# Patient Record
Sex: Male | Born: 1965 | Race: Black or African American | Hispanic: No | Marital: Married | Smoking: Former smoker
Health system: Southern US, Community
[De-identification: ages and names within clinical notes are randomized; demographics above are authoritative.]

## PROBLEM LIST (undated history)

## (undated) DIAGNOSIS — E119 Type 2 diabetes mellitus without complications: Secondary | ICD-10-CM

## (undated) DIAGNOSIS — E669 Obesity, unspecified: Secondary | ICD-10-CM

## (undated) DIAGNOSIS — I10 Essential (primary) hypertension: Secondary | ICD-10-CM

---

## 2009-07-06 ENCOUNTER — Ambulatory Visit: Payer: Self-pay | Admitting: Family Medicine

## 2010-02-17 ENCOUNTER — Ambulatory Visit: Payer: Self-pay | Admitting: Internal Medicine

## 2014-09-29 ENCOUNTER — Ambulatory Visit: Payer: PRIVATE HEALTH INSURANCE

## 2014-09-29 ENCOUNTER — Ambulatory Visit
Admission: EM | Admit: 2014-09-29 | Discharge: 2014-09-29 | Disposition: A | Discharge status: Home / Self Care | Payer: PRIVATE HEALTH INSURANCE | Attending: Family Medicine | Admitting: Family Medicine

## 2014-09-29 DIAGNOSIS — T148XXA Other injury of unspecified body region, initial encounter: Secondary | ICD-10-CM

## 2014-09-29 DIAGNOSIS — T148 Other injury of unspecified body region: Secondary | ICD-10-CM

## 2014-09-29 DIAGNOSIS — S161XXA Strain of muscle, fascia and tendon at neck level, initial encounter: Secondary | ICD-10-CM

## 2014-09-29 HISTORY — DX: Obesity, unspecified: E66.9

## 2014-09-29 MED ORDER — KETOROLAC TROMETHAMINE 60 MG/2ML IM SOLN
60.0000 mg | Freq: Once | INTRAMUSCULAR | Status: AC
  Administered 2014-09-29: 60 mg via INTRAMUSCULAR

## 2014-09-29 MED ORDER — CYCLOBENZAPRINE HCL 10 MG PO TABS: 10.0000 mg | ORAL_TABLET | Freq: Three times a day (TID) | ORAL | Status: DC | PRN

## 2014-09-29 MED ORDER — IBUPROFEN 800 MG PO TABS: 800.0000 mg | ORAL_TABLET | Freq: Three times a day (TID) | ORAL | Status: DC | PRN

## 2014-09-29 NOTE — ED Provider Notes (Signed)
[facility] Emergency Department Provider Note  ____________________________________________  Time seen: Approximately 9:59 AM  I have reviewed the triage vital signs and the nursing notes.   HISTORY  Chief Complaint Neck Injury   HPI Douglas Hopkins is a 49 y.o. male presents with complaints of left lateral neck pain. Patient reports that this past Friday he had been sitting still for at least an hour watching TV and states that he then turned his neck to the left and the right. States immediately after turning head to the left he felt a pain to the left side of his neck that is sharp. Patient states that pain was present for a few minutes and then got better with rubbing the area but never fully resolved. States Saturday morning woke up and the left neck had pain present again with movement and has continued.  Patient states that pain is primarily with movement. Patient states that he sits still and does not move he has no pain. Denies pain radiation. Denies chest pain or shortness of breath. States pain is improved with over-the-counter ibuprofen and heat compresses. Denies fall or direct injury. Denies arm radiation.Denies tingling or numbness. States left arm dominant.   States pain improves with ibuprofen and heat and rubbing the area. States feels intermittent spasms in that area. States current pain as states he is sitting still, however states once moving pain is 7/10 and sharp and "catching". Denies pain radiation. States pain is constantly in same area and present with movement.    Past Medical History  Diagnosis Date  . Obesity     There are no active problems to display for this patient.   History reviewed. No pertinent past surgical history.  No current outpatient prescriptions on file.  Allergies Review of patient's allergies indicates no known allergies.  Family History  Problem Relation Age of Onset  . Diabetes Mother   . Hypertension  Father   Denies cardiac history.   Social History Social History  Substance Use Topics  . Smoking status: Former Games developer  . Smokeless tobacco: None  . Alcohol Use: Yes     Comment: socially    Review of Systems Constitutional: No fever/chills Eyes: No visual changes. ENT: No sore throat. Cardiovascular: Denies chest pain. Respiratory: Denies shortness of breath. Gastrointestinal: No abdominal pain.  No nausea, no vomiting.  No diarrhea.  No constipation. Genitourinary: Negative for dysuria. Musculoskeletal: Negative for back pain.left neck pain as above.  Skin: Negative for rash. Neurological: Negative for headaches, focal weakness or numbness.  10-point ROS otherwise negative.  ____________________________________________   PHYSICAL EXAM:  VITAL SIGNS: ED Triage Vitals  Enc Vitals Group     BP 09/29/14 0938 155/83 mmHg     Pulse Rate 09/29/14 0938 92     Resp 09/29/14 0938 16     Temp 09/29/14 0938 97.9 F (36.6 C)     Temp Source 09/29/14 0938 Oral     SpO2 09/29/14 0938 98 %     Weight 09/29/14 0938 280 lb (127.007 kg)     Height 09/29/14 0938 6' (1.829 m)     Head Cir --      Peak Flow --      Pain Score 09/29/14 0940 7     Pain Loc --      Pain Edu? --      Excl. in GC? --     Constitutional: Alert and oriented. Well appearing and in no acute distress. Eyes: Conjunctivae are  normal. PERRL. EOMI. Head: Atraumatic.  Ears: no erythema, normal TMs bilaterally.   Nose: No congestion/rhinnorhea.  Mouth/Throat: Mucous membranes are moist.  Oropharynx non-erythematous. No carotid bruits. Neck: No stridor.  No cervical spine tenderness to palpation. Hematological/Lymphatic/Immunilogical: No cervical lymphadenopathy. Cardiovascular: Normal rate, regular rhythm. Grossly normal heart sounds.  Good peripheral circulation. Respiratory: Normal respiratory effort.  No retractions. Lungs CTAB. Gastrointestinal: Soft and nontender. No distention. Normal Bowel sounds.   No abdominal bruits. No CVA tenderness. Musculoskeletal: No lower or upper extremity tenderness nor edema.  No joint effusions. Bilateral pedal pulses equal and easily palpated. Except: No thoracic or lumbar tenderness to palpation. No right trapezius tenderness to palpation. Minimal lower cervical midline tenderness to palpation, minimal to mild left paracervical and mild to moderate left trapezius tenderness to palpation. Positive muscle spasm elicited with left and right rotation movements. No bony tenderness to left shoulder. Left arm nontender. Bilateral hand grips equal and strong. 5 out of 5 strength to bilateral upper extremities. Bilateral distal radial pulses equal and easily palpated. Sensation and motor intact to bilateral upper extremities. Neurologic:  Normal speech and language. No gross focal neurologic deficits are appreciated. No gait instability. Skin:  Skin is warm, dry and intact. No rash noted. Psychiatric: Mood and affect are normal. Speech and behavior are normal.  ____________________________________________   LABS (all labs ordered are listed, but only abnormal results are displayed)  Labs Reviewed - No data to display  RADIOLOGY  EXAM: CERVICAL SPINE 4+ VIEWS  COMPARISON: None.  FINDINGS: Frontal, lateral, open-mouth odontoid, and bilateral oblique views were obtained. There is no demonstrable fracture or spondylolisthesis. Prevertebral soft tissues and predental space regions are normal. The disc spaces appear normal. There is an anterior osteophyte along the inferior aspect of C5. There is a small amount of calcification in the anterior ligament at C5-6. There is facet osteoarthritic change with exit foraminal narrowing at C3-4, C4-5, and C5-6 bilaterally.  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 No fracture or spondylolisthesis.   Electronically Signed By: Bretta Bang III M.D. On: 09/29/2014 10:57         INITIAL IMPRESSION / ASSESSMENT AND PLAN / ED COURSE  Pertinent labs & imaging results that were available during my care of the patient were reviewed by me and considered in my medical decision making (see chart for details).  Very well appearing. No acute distress. Presents for complaints of left neck pain post twisting injury. Improves with OTC ibuprofen, heat and rubbing. States pain is only with movement and feels "spasms". Pain fully reproducible per patient with direct palpation and left and right rotation. Minimal midline TTP.  Denies chest pain, shortness of breath, pain radiation, numbness or tingling or changes in sensation.   Xray negative for fracture or spondylolisthesis. 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   Presents with left sided paracervical and left trapezius pain, point muscular TTP, pain fully reproducible. Post  IM toradol reports feeling better. Will treat with oral ibuprofen, flexeril and supportive treatments. Discussed strict follow-up and return parameters. Patient verbalized understanding and agreed to plan. Follow-up with primary care physician this week.   Patient blood pressure elevated in urgent care patient reports that no history of high blood pressure and reports that his blood pressures probably elevated that he is hurting and stressed, as normal recently when checked. Counseled patient and patient wife on phone to monitor at home and keep a journal. Discussed follow-up closely with primary care physician within  the next week for follow-up and further reevaluation. Patient denies chest pain, shortness of breath, headache, dizziness, vision changes or other complaints. Discussed strict follow-up and return parameters. Patient verbalizes understanding and agreed to plan.   ____________________________________________   FINAL CLINICAL IMPRESSION(S) / ED DIAGNOSES  Final diagnoses:  Muscle strain  Cervical  strain, initial encounter       Renford Dills, NP 09/29/14 1406

## 2014-09-29 NOTE — Discharge Instructions (Signed)
Take medication as prescribed. Continue to alternate heat and ice for comfort. Avoid strenuous activity. Do not take muscle relaxant while driving or at work.  Follow-up closely with her primary care physician this week. Return to urgent care for new or worsening concerns.  Cervical Strain  Cervical strain and sprain are injuries that commonly occur when the neck is forcefully whipped backward or forward, such as during a motor vehicle accident or during contact sports. The muscles, ligaments, tendons, discs, and nerves of the neck are susceptible to injury when this occurs. RISK FACTORS Risk of having a whiplash injury increases if:  Osteoarthritis of the spine.  Situations that make head or neck accidents or trauma more likely.  High-risk sports (football, rugby, wrestling, hockey, auto racing, gymnastics, diving, contact karate, or boxing).  Poor strength and flexibility of the neck.  Previous neck injury.  Poor tackling technique.  Improperly fitted or padded equipment. SYMPTOMS   Pain or stiffness in the front or back of neck or both.  Symptoms may present immediately or up to 24 hours after injury.  Dizziness, headache, nausea, and vomiting.  Muscle spasm with soreness and stiffness in the neck.  Tenderness and swelling at the injury site. PREVENTION  Learn and use proper technique (avoid tackling with the head, spearing, and head-butting; use proper falling techniques to avoid landing on the head).  Warm up and stretch properly before activity.  Maintain physical fitness:  Strength, flexibility, and endurance.  Cardiovascular fitness.  Wear properly fitted and padded protective equipment, such as padded soft collars, for participation in contact sports. PROGNOSIS  Recovery from cervical strain and sprain injuries is dependent on the extent of the injury. These injuries are usually curable in 1 week to 3 months with appropriate treatment.  RELATED COMPLICATIONS     Temporary numbness and weakness may occur if the nerve roots are damaged, and this may persist until the nerve has completely healed.  Chronic pain due to frequent recurrence of symptoms.  Prolonged healing, especially if activity is resumed too soon (before complete recovery). TREATMENT  Treatment initially involves the use of ice and medication to help reduce pain and inflammation. It is also important to perform strengthening and stretching exercises and modify activities that worsen symptoms so the injury does not get worse. These exercises may be performed at home or with a therapist. For patients who experience severe symptoms, a soft, padded collar may be recommended to be worn around the neck.  Improving your posture may help reduce symptoms. Posture improvement includes pulling your chin and abdomen in while sitting or standing. If you are sitting, sit in a firm chair with your buttocks against the back of the chair. While sleeping, try replacing your pillow with a small towel rolled to 2 inches in diameter, or use a cervical pillow or soft cervical collar. Poor sleeping positions delay healing.  For patients with nerve root damage, which causes numbness or weakness, the use of a cervical traction apparatus may be recommended. Surgery is rarely necessary for these injuries. However, cervical strain and sprains that are present at birth (congenital) may require surgery. MEDICATION   If pain medication is necessary, nonsteroidal anti-inflammatory medications, such as aspirin and ibuprofen, or other minor pain relievers, such as acetaminophen, are often recommended.  Do not take pain medication for 7 days before surgery.  Prescription pain relievers may be given if deemed necessary by your caregiver. Use only as directed and only as much as you need. HEAT AND  COLD:   Cold treatment (icing) relieves pain and reduces inflammation. Cold treatment should be applied for 10 to 15 minutes every  2 to 3 hours for inflammation and pain and immediately after any activity that aggravates your symptoms. Use ice packs or an ice massage.  Heat treatment may be used prior to performing the stretching and strengthening activities prescribed by your caregiver, physical therapist, or athletic trainer. Use a heat pack or a warm soak. SEEK MEDICAL CARE IF:   Symptoms get worse or do not improve in 2 weeks despite treatment.  New, unexplained symptoms develop (drugs used in treatment may produce side effects). EXERCISES RANGE OF MOTION (ROM) AND STRETCHING EXERCISES - Cervical Strain and Sprain These exercises may help you when beginning to rehabilitate your injury. In order to successfully resolve your symptoms, you must improve your posture. These exercises are designed to help reduce the forward-head and rounded-shoulder posture which contributes to this condition. Your symptoms may resolve with or without further involvement from your physician, physical therapist or athletic trainer. While completing these exercises, remember:   Restoring tissue flexibility helps normal motion to return to the joints. This allows healthier, less painful movement and activity.  An effective stretch should be held for at least 20 seconds, although you may need to begin with shorter hold times for comfort.  A stretch should never be painful. You should only feel a gentle lengthening or release in the stretched tissue. STRETCH- Axial Extensors  Lie on your back on the floor. You may bend your knees for comfort. Place a rolled-up hand towel or dish towel, about 2 inches in diameter, under the part of your head that makes contact with the floor.  Gently tuck your chin, as if trying to make a "double chin," until you feel a gentle stretch at the base of your head.  Hold __________ seconds. Repeat __________ times. Complete this exercise __________ times per day.  STRETCH - Axial Extension   Stand or sit on a firm  surface. Assume a good posture: chest up, shoulders drawn back, abdominal muscles slightly tense, knees unlocked (if standing) and feet hip width apart.  Slowly retract your chin so your head slides back and your chin slightly lowers. Continue to look straight ahead.  You should feel a gentle stretch in the back of your head. Be certain not to feel an aggressive stretch since this can cause headaches later.  Hold for __________ seconds. Repeat __________ times. Complete this exercise __________ times per day. STRETCH - Cervical Side Bend   Stand or sit on a firm surface. Assume a good posture: chest up, shoulders drawn back, abdominal muscles slightly tense, knees unlocked (if standing) and feet hip width apart.  Without letting your nose or shoulders move, slowly tip your right / left ear to your shoulder until your feel a gentle stretch in the muscles on the opposite side of your neck.  Hold __________ seconds. Repeat __________ times. Complete this exercise __________ times per day. STRETCH - Cervical Rotators   Stand or sit on a firm surface. Assume a good posture: chest up, shoulders drawn back, abdominal muscles slightly tense, knees unlocked (if standing) and feet hip width apart.  Keeping your eyes level with the ground, slowly turn your head until you feel a gentle stretch along the back and opposite side of your neck.  Hold __________ seconds. Repeat __________ times. Complete this exercise __________ times per day. RANGE OF MOTION - Neck Circles   Stand or  sit on a firm surface. Assume a good posture: chest up, shoulders drawn back, abdominal muscles slightly tense, knees unlocked (if standing) and feet hip width apart.  Gently roll your head down and around from the back of one shoulder to the back of the other. The motion should never be forced or painful.  Repeat the motion 10-20 times, or until you feel the neck muscles relax and loosen. Repeat __________ times. Complete  the exercise __________ times per day. STRENGTHENING EXERCISES - Cervical Strain and Sprain These exercises may help you when beginning to rehabilitate your injury. They may resolve your symptoms with or without further involvement from your physician, physical therapist, or athletic trainer. While completing these exercises, remember:   Muscles can gain both the endurance and the strength needed for everyday activities through controlled exercises.  Complete these exercises as instructed by your physician, physical therapist, or athletic trainer. Progress the resistance and repetitions only as guided.  You may experience muscle soreness or fatigue, but the pain or discomfort you are trying to eliminate should never worsen during these exercises. If this pain does worsen, stop and make certain you are following the directions exactly. If the pain is still present after adjustments, discontinue the exercise until you can discuss the trouble with your clinician. STRENGTH - Cervical Flexors, Isometric  Face a wall, standing about 6 inches away. Place a small pillow, a ball about 6-8 inches in diameter, or a folded towel between your forehead and the wall.  Slightly tuck your chin and gently push your forehead into the soft object. Push only with mild to moderate intensity, building up tension gradually. Keep your jaw and forehead relaxed.  Hold 10 to 20 seconds. Keep your breathing relaxed.  Release the tension slowly. Relax your neck muscles completely before you start the next repetition. Repeat __________ times. Complete this exercise __________ times per day. STRENGTH- Cervical Lateral Flexors, Isometric   Stand about 6 inches away from a wall. Place a small pillow, a ball about 6-8 inches in diameter, or a folded towel between the side of your head and the wall.  Slightly tuck your chin and gently tilt your head into the soft object. Push only with mild to moderate intensity, building up  tension gradually. Keep your jaw and forehead relaxed.  Hold 10 to 20 seconds. Keep your breathing relaxed.  Release the tension slowly. Relax your neck muscles completely before you start the next repetition. Repeat __________ times. Complete this exercise __________ times per day. STRENGTH - Cervical Extensors, Isometric   Stand about 6 inches away from a wall. Place a small pillow, a ball about 6-8 inches in diameter, or a folded towel between the back of your head and the wall.  Slightly tuck your chin and gently tilt your head back into the soft object. Push only with mild to moderate intensity, building up tension gradually. Keep your jaw and forehead relaxed.  Hold 10 to 20 seconds. Keep your breathing relaxed.  Release the tension slowly. Relax your neck muscles completely before you start the next repetition. Repeat __________ times. Complete this exercise __________ times per day. POSTURE AND BODY MECHANICS CONSIDERATIONS - Cervical Strain and Sprain Keeping correct posture when sitting, standing or completing your activities will reduce the stress put on different body tissues, allowing injured tissues a chance to heal and limiting painful experiences. The following are general guidelines for improved posture. Your physician or physical therapist will provide you with any instructions specific to your  needs. While reading these guidelines, remember:  The exercises prescribed by your provider will help you have the flexibility and strength to maintain correct postures.  The correct posture provides the optimal environment for your joints to work. All of your joints have less wear and tear when properly supported by a spine with good posture. This means you will experience a healthier, less painful body.  Correct posture must be practiced with all of your activities, especially prolonged sitting and standing. Correct posture is as important when doing repetitive low-stress activities  (typing) as it is when doing a single heavy-load activity (lifting). PROLONGED STANDING WHILE SLIGHTLY LEANING FORWARD When completing a task that requires you to lean forward while standing in one place for a long time, place either foot up on a stationary 2- to 4-inch high object to help maintain the best posture. When both feet are on the ground, the low back tends to lose its slight inward curve. If this curve flattens (or becomes too large), then the back and your other joints will experience too much stress, fatigue more quickly, and can cause pain.  RESTING POSITIONS Consider which positions are most painful for you when choosing a resting position. If you have pain with flexion-based activities (sitting, bending, stooping, squatting), choose a position that allows you to rest in a less flexed posture. You would want to avoid curling into a fetal position on your side. If your pain worsens with extension-based activities (prolonged standing, working overhead), avoid resting in an extended position such as sleeping on your stomach. Most people will find more comfort when they rest with their spine in a more neutral position, neither too rounded nor too arched. Lying on a non-sagging bed on your side with a pillow between your knees, or on your back with a pillow under your knees will often provide some relief. Keep in mind, being in any one position for a prolonged period of time, no matter how correct your posture, can still lead to stiffness. WALKING Walk with an upright posture. Your ears, shoulders, and hips should all line up. OFFICE WORK When working at a desk, create an environment that supports good, upright posture. Without extra support, muscles fatigue and lead to excessive strain on joints and other tissues. CHAIR:  A chair should be able to slide under your desk when your back makes contact with the back of the chair. This allows you to work closely.  The chair's height should allow  your eyes to be level with the upper part of your monitor and your hands to be slightly lower than your elbows.  Body position:  Your feet should make contact with the floor. If this is not possible, use a foot rest.  Keep your ears over your shoulders. This will reduce stress on your neck and low back. Document Released: 01/01/2005 Document Revised: 05/18/2013 Document Reviewed: 04/15/2008 [facility] Patient Information 2015 [place], [place]. This information is not intended to replace advice given to you by your health care provider. Make sure you discuss any questions you have with your health care provider.  Muscle Strain A muscle strain (pulled muscle) happens when a muscle is stretched beyond normal length. It happens when a sudden, violent force stretches your muscle too far. Usually, a few of the fibers in your muscle are torn. Muscle strain is common in athletes. Recovery usually takes 1-2 weeks. Complete healing takes 5-6 weeks.  HOME CARE   Follow the PRICE method of treatment to help your injury  get better. Do this the first 2-3 days after the injury:  Protect. Protect the muscle to keep it from getting injured again.  Rest. Limit your activity and rest the injured body part.  Ice. Put ice in a plastic bag. Place a towel between your skin and the bag. Then, apply the ice and leave it on from 15-20 minutes each hour. After the third day, switch to moist heat packs.  Compression. Use a splint or elastic bandage on the injured area for comfort. Do not put it on too tightly.  Elevate. Keep the injured body part above the level of your heart.  Only take medicine as told by your doctor.  Warm up before doing exercise to prevent future muscle strains. GET HELP IF:   You have more pain or puffiness (swelling) in the injured area.  You feel numbness, tingling, or notice a loss of strength in the injured area. MAKE SURE YOU:   Understand these instructions.  Will watch your  condition.  Will get help right away if you are not doing well or get worse. Document Released: 10/11/2007 Document Revised: 10/22/2012 Document Reviewed: 07/31/2012 [facility] Patient Information 2015 [place], [place]. This information is not intended to replace advice given to you by your health care provider. Make sure you discuss any questions you have with your health care provider.

## 2014-09-29 NOTE — ED Notes (Signed)
States was sitting at home Friday and when turned head to the right, felt a sudden pop lateral left neck. Ibuprofen helped for just a short time.Now pain radiates to left shoulder and up left lateral neck. Denies SOB, No SOB. Denies chest pain

## 2017-04-28 IMAGING — CR DG CERVICAL SPINE COMPLETE 4+V
6 series · 6 of 6 positions shown · non-contrast
Comparison: None.

CLINICAL DATA: Cervicalgia with radicular symptoms left upper
extremity

EXAM:
CERVICAL SPINE  4+ VIEWS

[c-spine lat (1 of 2)]
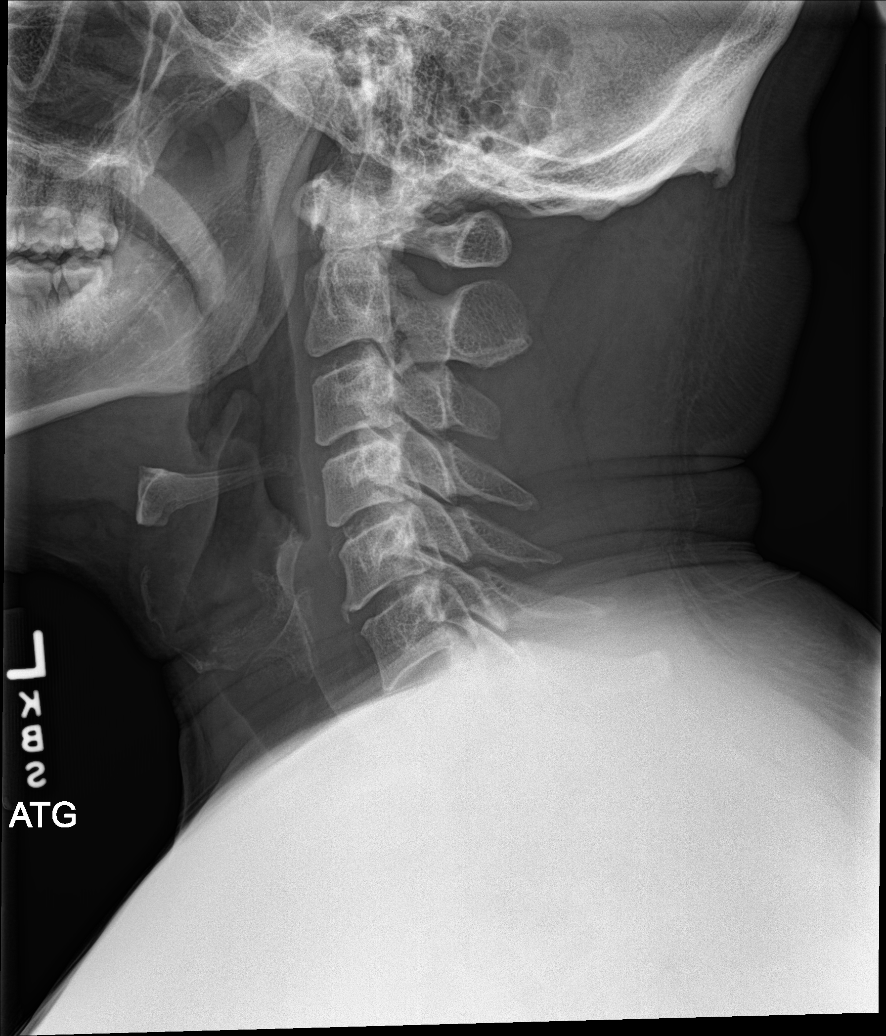

[c-spine obl (1 of 2)]
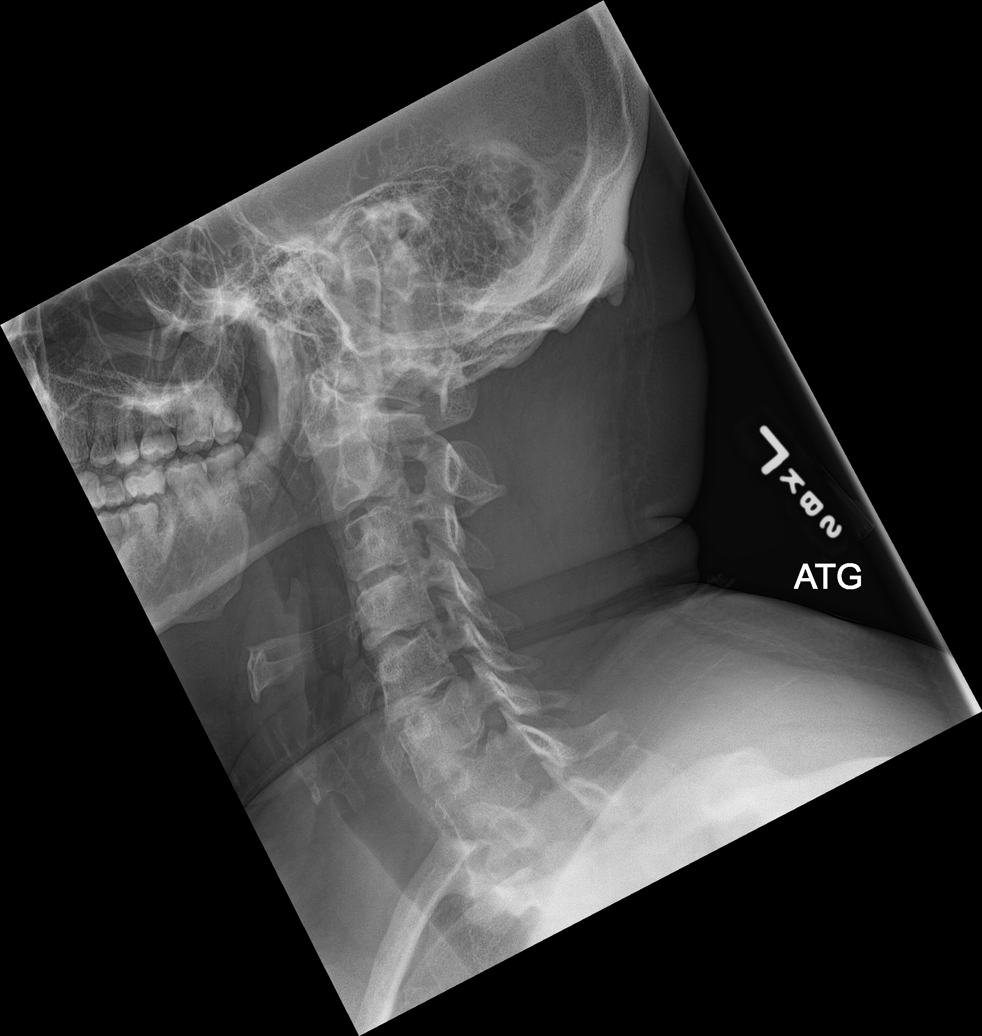

[c-spine obl (2 of 2)]
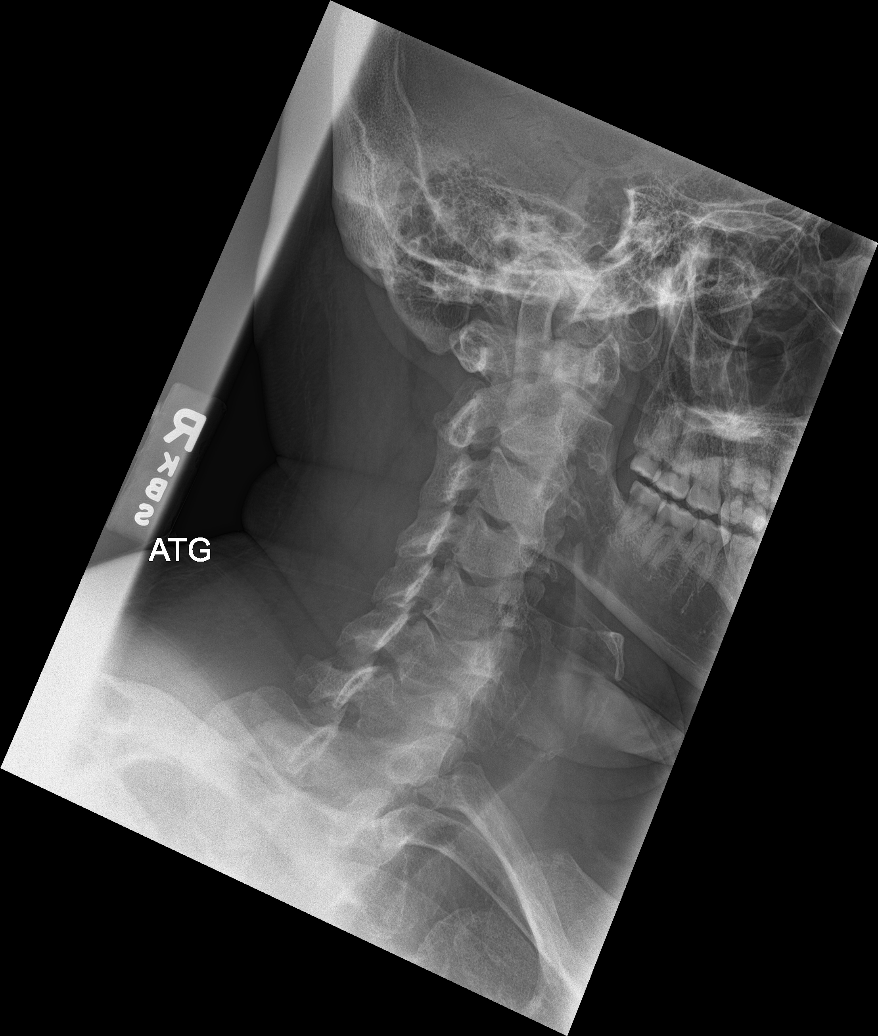

[c-spine ap]
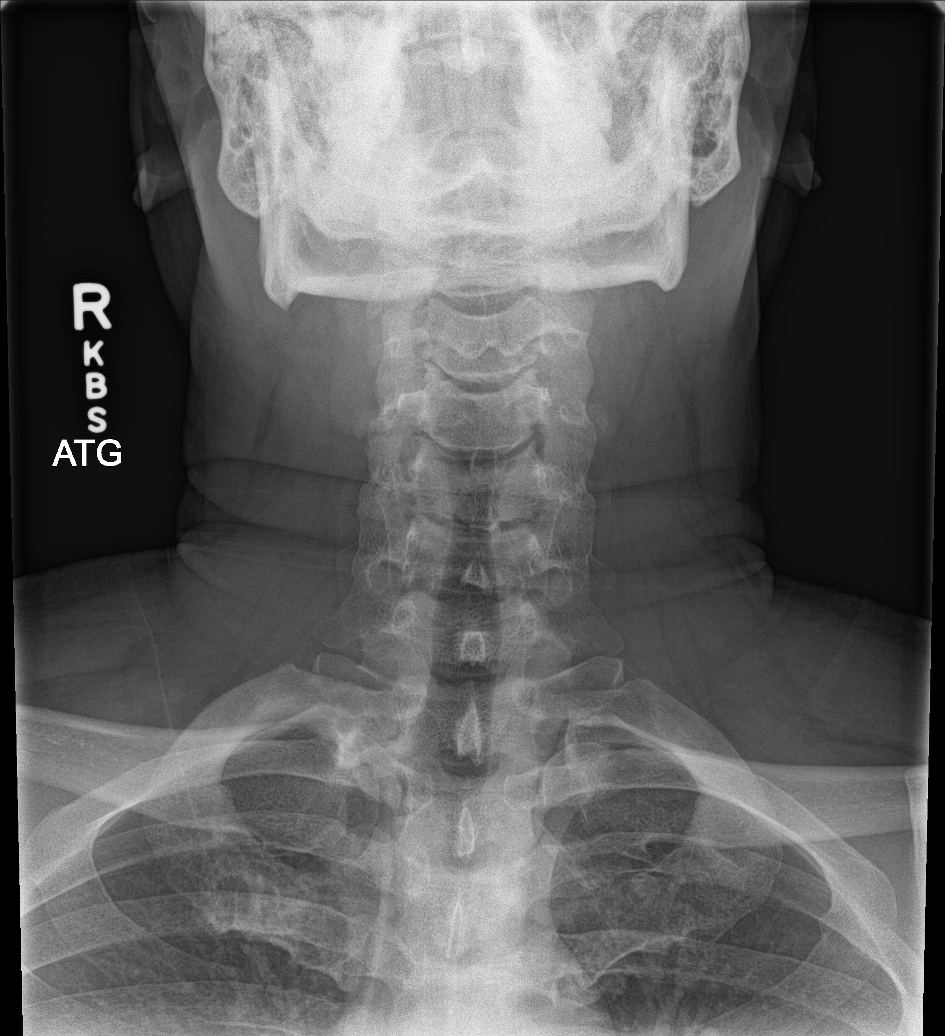

[c-spine open mouth]
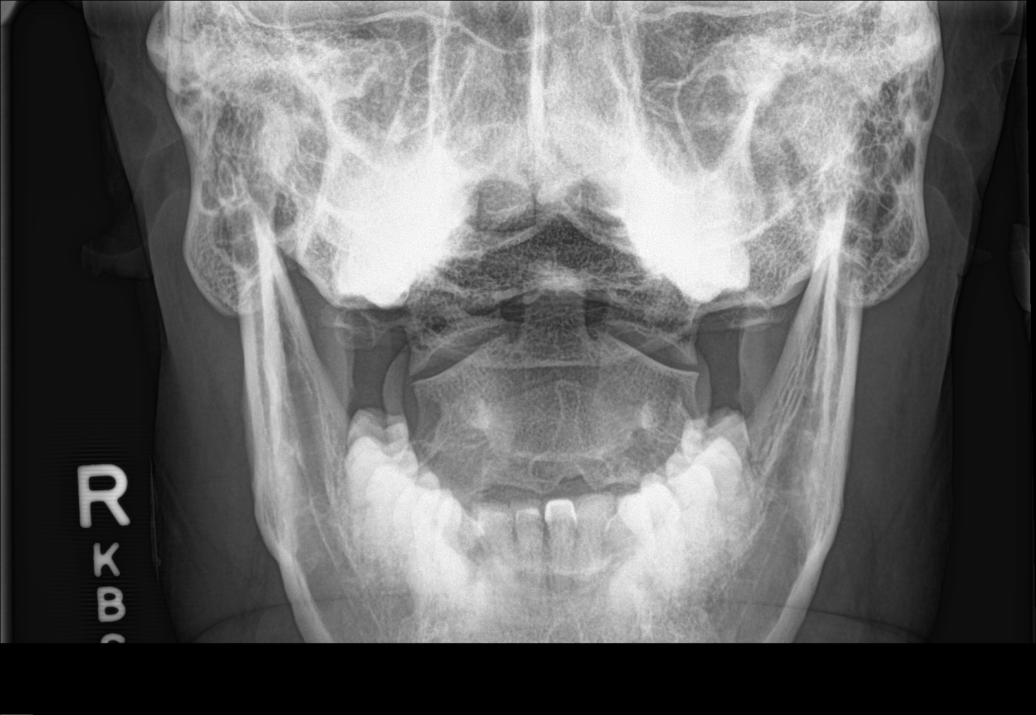

[c-spine lat (2 of 2)]
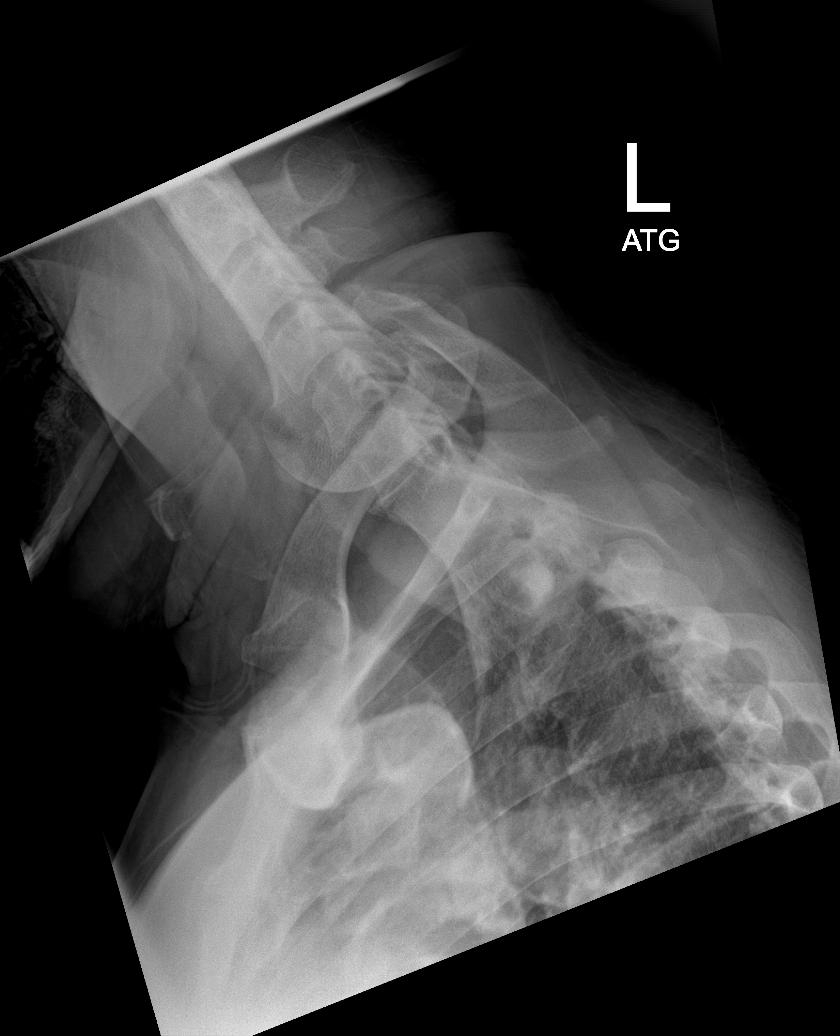

[6 of 6 positions shown; findings below may reference images not displayed]

FINDINGS: Frontal, lateral, open-mouth odontoid, and bilateral oblique views
were obtained. There is no demonstrable fracture or
spondylolisthesis. Prevertebral soft tissues and predental space
regions are normal. The disc spaces appear normal. There is an
anterior osteophyte along the inferior aspect of C5. There is a
small amount of calcification in the anterior ligament at C5-6.
There is facet osteoarthritic change with exit foraminal narrowing
at C3-4, C4-5, and C5-6 bilaterally.
IMPRESSION: Facet osteoarthritic change at several levels bilaterally. No
fracture or spondylolisthesis.

## 2020-03-03 ENCOUNTER — Other Ambulatory Visit
Admission: RE | Admit: 2020-03-03 | Discharge: 2020-03-03 | Disposition: A | Discharge status: Home / Self Care | Payer: BC Managed Care – PPO | Source: Ambulatory Visit | Attending: Gastroenterology | Admitting: Gastroenterology

## 2020-03-03 ENCOUNTER — Other Ambulatory Visit: Payer: Self-pay

## 2020-03-03 DIAGNOSIS — Z01812 Encounter for preprocedural laboratory examination: Secondary | ICD-10-CM | POA: Diagnosis present

## 2020-03-03 DIAGNOSIS — Z20822 Contact with and (suspected) exposure to covid-19: Secondary | ICD-10-CM | POA: Insufficient documentation

## 2020-03-03 LAB — SARS CORONAVIRUS 2 (TAT 6-24 HRS): SARS Coronavirus 2: NEGATIVE

## 2020-03-04 ENCOUNTER — Encounter: Payer: Self-pay | Admitting: *Deleted

## 2020-03-07 ENCOUNTER — Ambulatory Visit: Payer: BC Managed Care – PPO | Admitting: Anesthesiology

## 2020-03-07 ENCOUNTER — Other Ambulatory Visit: Payer: Self-pay

## 2020-03-07 ENCOUNTER — Encounter
Admission: RE | Disposition: A | Discharge status: Home / Self Care | Payer: Self-pay | Source: Home / Self Care | Attending: Gastroenterology

## 2020-03-07 ENCOUNTER — Ambulatory Visit
Admission: RE | Admit: 2020-03-07 | Discharge: 2020-03-07 | Disposition: A | Discharge status: Home / Self Care | Payer: BC Managed Care – PPO | Attending: Gastroenterology | Admitting: Gastroenterology

## 2020-03-07 ENCOUNTER — Encounter: Payer: Self-pay | Admitting: Anesthesiology

## 2020-03-07 DIAGNOSIS — Z87891 Personal history of nicotine dependence: Secondary | ICD-10-CM | POA: Diagnosis not present

## 2020-03-07 DIAGNOSIS — Z538 Procedure and treatment not carried out for other reasons: Secondary | ICD-10-CM | POA: Insufficient documentation

## 2020-03-07 DIAGNOSIS — Z1211 Encounter for screening for malignant neoplasm of colon: Secondary | ICD-10-CM | POA: Diagnosis not present

## 2020-03-07 HISTORY — PX: COLONOSCOPY WITH PROPOFOL: SHX5780

## 2020-03-07 SURGERY — COLONOSCOPY WITH PROPOFOL
Anesthesia: General

## 2020-03-07 MED ORDER — SODIUM CHLORIDE 0.9 % IV SOLN
INTRAVENOUS | Status: DC
  Administered 2020-03-07: 20 mL/h via INTRAVENOUS

## 2020-03-07 MED ORDER — PROPOFOL 500 MG/50ML IV EMUL
INTRAVENOUS | Status: AC
  Filled 2020-03-07: qty 50

## 2020-03-07 MED ORDER — PROPOFOL 500 MG/50ML IV EMUL
INTRAVENOUS | Status: DC | PRN
  Administered 2020-03-07: 150 ug/kg/min via INTRAVENOUS

## 2020-03-07 NOTE — Anesthesia Procedure Notes (Signed)
Performed by: Tonia Ghent Pre-anesthesia Checklist: Patient identified, Emergency Drugs available, Suction available and Patient being monitored Patient Re-evaluated:Patient Re-evaluated prior to induction Oxygen Delivery Method: Simple face mask Preoxygenation: Pre-oxygenation with 100% oxygen Induction Type: IV induction Placement Confirmation: positive ETCO2 and CO2 detector

## 2020-03-07 NOTE — Interval H&P Note (Signed)
History and Physical Interval Note:  03/07/2020 9:31 AM  Douglas Hopkins  has presented today for surgery, with the diagnosis of screening.  The various methods of treatment have been discussed with the patient and family. After consideration of risks, benefits and other options for treatment, the patient has consented to  Procedure(s): COLONOSCOPY WITH PROPOFOL (N/A) as a surgical intervention.  The patient's history has been reviewed, patient examined, no change in status, stable for surgery.  I have reviewed the patient's chart and labs.  Questions were answered to the patient's satisfaction.     Regis Bill  Ok to proceed with colonoscopy

## 2020-03-07 NOTE — Transfer of Care (Signed)
Immediate Anesthesia Transfer of Care Note  Patient: Douglas Hopkins  Procedure(s) Performed: COLONOSCOPY WITH PROPOFOL (N/A )  Patient Location: PACU  Anesthesia Type:General  Level of Consciousness: awake and sedated  Airway & Oxygen Therapy: Patient Spontanous Breathing and Patient connected to nasal cannula oxygen  Post-op Assessment: Report given to RN and Post -op Vital signs reviewed and stable  Post vital signs: Reviewed and stable  Last Vitals:  Vitals Value Taken Time  BP    Temp    Pulse    Resp    SpO2      Last Pain:  Vitals:   03/07/20 0920  TempSrc: Temporal  PainSc: 0-No pain         Complications: No complications documented.
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
## 2020-03-07 NOTE — Op Note (Signed)
[facility] Gastroenterology Patient Name: Douglas Hopkins Procedure Date: 03/07/2020 9:37 AM MRN: [id number] Account #: [id number] Date of Birth: 10-08-1965 Admit Type: Outpatient Age: 55 Room: [facility] ENDO ROOM 3 Gender: Male Note Status: Finalized Procedure:             Colonoscopy Indications:           Screening for colorectal malignant neoplasm Providers:             Andrey Farmer MD, MD Medicines:             Monitored Anesthesia Care Complications:         No immediate complications. Procedure:             Pre-Anesthesia Assessment:                        - Prior to the procedure, a History and Physical was                         performed, and patient medications and allergies were                         reviewed. The patient is competent. The risks and                         benefits of the procedure and the sedation options and                         risks were discussed with the patient. All questions                         were answered and informed consent was obtained.                         Patient identification and proposed procedure were                         verified by the physician, the nurse, the anesthetist                         and the technician in the endoscopy suite. Mental                         Status Examination: alert and oriented. Airway                         Examination: normal oropharyngeal airway and neck                         mobility. Respiratory Examination: clear to                         auscultation. CV Examination: normal. Prophylactic                         Antibiotics: The patient does not require prophylactic                         antibiotics. Prior Anticoagulants: The patient has  taken no previous anticoagulant or antiplatelet                         agents. ASA Grade Assessment: I - A normal, healthy                         patient. After reviewing the risks and benefits, the                          patient was deemed in satisfactory condition to                         undergo the procedure. The anesthesia plan was to use                         monitored anesthesia care (MAC). Immediately prior to                         administration of medications, the patient was                         re-assessed for adequacy to receive sedatives. The                         heart rate, respiratory rate, oxygen saturations,                         blood pressure, adequacy of pulmonary ventilation, and                         response to care were monitored throughout the                         procedure. The physical status of the patient was                         re-assessed after the procedure.                        After obtaining informed consent, the colonoscope was                         passed under direct vision. Throughout the procedure,                         the patient's blood pressure, pulse, and oxygen                         saturations were monitored continuously. The                         Colonoscope was introduced through the anus with the                         intention of advancing to the cecum. The scope was                         advanced to the sigmoid colon before the procedure was  aborted. Medications were given. The colonoscopy was                         aborted due to poor bowel prep with stool present. Findings:      The perianal and digital rectal examinations were normal.      Semi-solid stool was found in the sigmoid colon, precluding       visualization. Impression:            - The procedure was aborted due to poor bowel prep                         with stool present.                        - Stool in the sigmoid colon.                        - No specimens collected. Recommendation:        - Discharge patient to home.                        - Resume previous diet.                        - Continue  present medications.                        - Repeat colonoscopy at the next available appointment                         because the bowel preparation was suboptimal.                        - Return to referring physician as previously                         scheduled. Procedure Code(s):     --- Professional ---                        B2010, 53, Colorectal cancer screening; colonoscopy on                         individual not meeting criteria for high risk Diagnosis Code(s):     --- Professional ---                        Z12.11, Encounter for screening for malignant neoplasm                         of colon CPT copyright 2019 [employer]. All rights reserved. The codes documented in this report are preliminary and upon coder review may  be revised to meet current compliance requirements. Andrey Farmer MD, MD 03/07/2020 9:55:52 AM Number of Addenda: 0 Note Initiated On: 03/07/2020 9:37 AM Total Procedure Duration: 0 hours 2 minutes 13 seconds  Estimated Blood Loss:  Estimated blood loss: none.      [facility]
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
## 2020-03-07 NOTE — Anesthesia Postprocedure Evaluation (Signed)
Anesthesia Post Note  Patient: Douglas Hopkins  Procedure(s) Performed: COLONOSCOPY WITH PROPOFOL (N/A )  Patient location during evaluation: Endoscopy Anesthesia Type: General Level of consciousness: awake and alert Pain management: pain level controlled Vital Signs Assessment: post-procedure vital signs reviewed and stable Respiratory status: spontaneous breathing, nonlabored ventilation, respiratory function stable and patient connected to nasal cannula oxygen Cardiovascular status: blood pressure returned to baseline and stable Postop Assessment: no apparent nausea or vomiting Anesthetic complications: no   No complications documented.   Last Vitals:  Vitals:   03/07/20 1007 03/07/20 1017  BP: 110/61 (!) 148/93  Pulse: 64 64  Resp: 16 (!) 23  Temp:    SpO2: 98% 97%    Last Pain:  Vitals:   03/07/20 1017  TempSrc:   PainSc: 0-No pain                 Cleda Mccreedy Dallie Patton

## 2020-03-09 ENCOUNTER — Encounter: Payer: Self-pay | Admitting: Gastroenterology

## 2020-05-13 ENCOUNTER — Encounter: Payer: Self-pay | Admitting: *Deleted

## 2020-05-13 ENCOUNTER — Other Ambulatory Visit: Admission: RE | Admit: 2020-05-13 | Payer: BC Managed Care – PPO | Source: Ambulatory Visit

## 2020-05-16 ENCOUNTER — Ambulatory Visit
Admission: RE | Admit: 2020-05-16 | Discharge: 2020-05-16 | Disposition: A | Discharge status: Home / Self Care | Payer: BC Managed Care – PPO | Attending: Gastroenterology | Admitting: Gastroenterology

## 2020-05-16 ENCOUNTER — Ambulatory Visit: Payer: BC Managed Care – PPO | Admitting: Anesthesiology

## 2020-05-16 ENCOUNTER — Encounter: Payer: Self-pay | Admitting: *Deleted

## 2020-05-16 ENCOUNTER — Encounter
Admission: RE | Disposition: A | Discharge status: Home / Self Care | Payer: Self-pay | Source: Home / Self Care | Attending: Gastroenterology

## 2020-05-16 DIAGNOSIS — K640 First degree hemorrhoids: Secondary | ICD-10-CM | POA: Diagnosis not present

## 2020-05-16 DIAGNOSIS — Z1211 Encounter for screening for malignant neoplasm of colon: Secondary | ICD-10-CM | POA: Insufficient documentation

## 2020-05-16 DIAGNOSIS — E669 Obesity, unspecified: Secondary | ICD-10-CM | POA: Insufficient documentation

## 2020-05-16 DIAGNOSIS — Z6835 Body mass index (BMI) 35.0-35.9, adult: Secondary | ICD-10-CM | POA: Insufficient documentation

## 2020-05-16 DIAGNOSIS — Z87891 Personal history of nicotine dependence: Secondary | ICD-10-CM | POA: Insufficient documentation

## 2020-05-16 HISTORY — PX: COLONOSCOPY WITH PROPOFOL: SHX5780

## 2020-05-16 HISTORY — DX: Essential (primary) hypertension: I10

## 2020-05-16 HISTORY — DX: Type 2 diabetes mellitus without complications: E11.9

## 2020-05-16 SURGERY — COLONOSCOPY WITH PROPOFOL
Anesthesia: General

## 2020-05-16 MED ORDER — FENTANYL CITRATE (PF) 100 MCG/2ML IJ SOLN
INTRAMUSCULAR | Status: DC | PRN
  Administered 2020-05-16: 25 ug via INTRAVENOUS
  Administered 2020-05-16: 50 ug via INTRAVENOUS
  Administered 2020-05-16: 25 ug via INTRAVENOUS

## 2020-05-16 MED ORDER — PROPOFOL 500 MG/50ML IV EMUL
INTRAVENOUS | Status: DC | PRN
  Administered 2020-05-16: 50 ug/kg/min via INTRAVENOUS

## 2020-05-16 MED ORDER — SODIUM CHLORIDE 0.9 % IV SOLN: INTRAVENOUS | Status: DC

## 2020-05-16 MED ORDER — MIDAZOLAM HCL 2 MG/2ML IJ SOLN
INTRAMUSCULAR | Status: DC | PRN
  Administered 2020-05-16: 2 mg via INTRAVENOUS

## 2020-05-16 MED ORDER — PROPOFOL 10 MG/ML IV BOLUS
INTRAVENOUS | Status: AC
  Filled 2020-05-16: qty 20

## 2020-05-16 MED ORDER — PROPOFOL 10 MG/ML IV BOLUS
INTRAVENOUS | Status: DC | PRN
  Administered 2020-05-16: 10 mg via INTRAVENOUS
  Administered 2020-05-16: 30 mg via INTRAVENOUS
  Administered 2020-05-16: 10 mg via INTRAVENOUS

## 2020-05-16 MED ORDER — FENTANYL CITRATE (PF) 100 MCG/2ML IJ SOLN
INTRAMUSCULAR | Status: AC
  Filled 2020-05-16: qty 2

## 2020-05-16 MED ORDER — LIDOCAINE HCL (PF) 2 % IJ SOLN
INTRAMUSCULAR | Status: AC
  Filled 2020-05-16: qty 10

## 2020-05-16 MED ORDER — MIDAZOLAM HCL 2 MG/2ML IJ SOLN
INTRAMUSCULAR | Status: AC
  Filled 2020-05-16: qty 2

## 2020-05-16 MED ORDER — LIDOCAINE HCL (CARDIAC) PF 100 MG/5ML IV SOSY
PREFILLED_SYRINGE | INTRAVENOUS | Status: DC | PRN
  Administered 2020-05-16: 80 mg via INTRAVENOUS

## 2020-05-16 NOTE — Anesthesia Postprocedure Evaluation (Signed)
Anesthesia Post Note  Patient: Douglas Hopkins  Procedure(s) Performed: COLONOSCOPY WITH PROPOFOL (N/A )  Patient location during evaluation: Endoscopy Anesthesia Type: General Level of consciousness: awake and alert Pain management: pain level controlled Vital Signs Assessment: post-procedure vital signs reviewed and stable Respiratory status: spontaneous breathing, nonlabored ventilation, respiratory function stable and patient connected to nasal cannula oxygen Cardiovascular status: blood pressure returned to baseline and stable Postop Assessment: no apparent nausea or vomiting Anesthetic complications: no   No complications documented.   Last Vitals:  Vitals:   05/16/20 1101 05/16/20 1210  BP: (!) 153/88 125/80  Pulse: 73   Resp: 18   Temp: (!) 36.2 C (!) 36.3 C  SpO2: 99%     Last Pain:  Vitals:   05/16/20 1240  TempSrc:   PainSc: 0-No pain                 Cleda Mccreedy Lyn Joens

## 2020-05-16 NOTE — Op Note (Signed)
[facility] Gastroenterology Patient Name: Douglas Hopkins Procedure Date: 05/16/2020 11:40 AM MRN: [id number] Account #: [id number] Date of Birth: Jul 12, 1965 Admit Type: Outpatient Age: 55 Room: [facility] ENDO ROOM 3 Gender: Male Note Status: Finalized Procedure:             Colonoscopy Indications:           Screening for colorectal malignant neoplasm Providers:             Andrey Farmer MD, MD Medicines:             Monitored Anesthesia Care Complications:         No immediate complications. Procedure:             Pre-Anesthesia Assessment:                        - Prior to the procedure, a History and Physical was                         performed, and patient medications and allergies were                         reviewed. The patient is competent. The risks and                         benefits of the procedure and the sedation options and                         risks were discussed with the patient. All questions                         were answered and informed consent was obtained.                         Patient identification and proposed procedure were                         verified by the physician, the nurse, the anesthetist                         and the technician in the endoscopy suite. Mental                         Status Examination: alert and oriented. Airway                         Examination: normal oropharyngeal airway and neck                         mobility. Respiratory Examination: clear to                         auscultation. CV Examination: normal. Prophylactic                         Antibiotics: The patient does not require prophylactic                         antibiotics. Prior Anticoagulants: The patient has  taken no previous anticoagulant or antiplatelet                         agents. ASA Grade Assessment: II - A patient with mild                         systemic disease. After reviewing the risks and                          benefits, the patient was deemed in satisfactory                         condition to undergo the procedure. The anesthesia                         plan was to use monitored anesthesia care (MAC).                         Immediately prior to administration of medications,                         the patient was re-assessed for adequacy to receive                         sedatives. The heart rate, respiratory rate, oxygen                         saturations, blood pressure, adequacy of pulmonary                         ventilation, and response to care were monitored                         throughout the procedure. The physical status of the                         patient was re-assessed after the procedure.                        After obtaining informed consent, the colonoscope was                         passed under direct vision. Throughout the procedure,                         the patient's blood pressure, pulse, and oxygen                         saturations were monitored continuously. The                         Colonoscope was introduced through the anus and                         advanced to the the cecum, identified by appendiceal                         orifice and ileocecal valve. The colonoscopy was  performed without difficulty. The patient tolerated                         the procedure well. The quality of the bowel                         preparation was adequate to identify polyps. Findings:      The perianal and digital rectal examinations were normal.      Internal hemorrhoids were found during retroflexion. The hemorrhoids       were Grade I (internal hemorrhoids that do not prolapse).      The exam was otherwise without abnormality on direct and retroflexion       views. Impression:            - Internal hemorrhoids.                        - The examination was otherwise normal on direct and                          retroflexion views.                        - No specimens collected. Recommendation:        - Discharge patient to home.                        - Resume previous diet.                        - Continue present medications.                        - Repeat colonoscopy in 5 years for screening purposes.                        - Return to referring physician as previously                         scheduled. Procedure Code(s):     --- Professional ---                        X5170, Colorectal cancer screening; colonoscopy on                         individual not meeting criteria for high risk Diagnosis Code(s):     --- Professional ---                        Z12.11, Encounter for screening for malignant neoplasm                         of colon                        K64.0, First degree hemorrhoids CPT copyright 2019 American Medical Association. All rights reserved. The codes documented in this report are preliminary and upon coder review may  be revised to meet current compliance requirements. Andrey Farmer MD, MD 05/16/2020 01:74:94 PM Number of Addenda: 0 Note Initiated On: 05/16/2020 11:40 AM Scope Withdrawal Time: 0 hours  10 minutes 56 seconds  Total Procedure Duration: 0 hours 16 minutes 13 seconds  Estimated Blood Loss:  Estimated blood loss: none.      [facility]
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
## 2020-05-16 NOTE — Interval H&P Note (Signed)
History and Physical Interval Note:  05/16/2020 11:43 AM  Douglas Hopkins  has presented today for surgery, with the diagnosis of CC SCREENING.  The various methods of treatment have been discussed with the patient and family. After consideration of risks, benefits and other options for treatment, the patient has consented to  Procedure(s): COLONOSCOPY WITH PROPOFOL (N/A) as a surgical intervention.  The patient's history has been reviewed, patient examined, no change in status, stable for surgery.  I have reviewed the patient's chart and labs.  Questions were answered to the patient's satisfaction.     Regis Bill  Ok to proceed with colonoscopy

## 2020-05-16 NOTE — Transfer of Care (Signed)
Immediate Anesthesia Transfer of Care Note  Patient: Douglas Hopkins  Procedure(s) Performed: COLONOSCOPY WITH PROPOFOL (N/A )  Patient Location: PACU  Anesthesia Type:General  Level of Consciousness: sedated  Airway & Oxygen Therapy: Patient Spontanous Breathing and Patient connected to nasal cannula oxygen  Post-op Assessment: Report given to RN and Post -op Vital signs reviewed and stable  Post vital signs: Reviewed and stable  Last Vitals:  Vitals Value Taken Time  BP 125/80 05/16/20 1210  Temp    Pulse 69 05/16/20 1212  Resp 17 05/16/20 1212  SpO2 99 % 05/16/20 1212  Vitals shown include unvalidated device data.  Last Pain:  Vitals:   05/16/20 1210  TempSrc:   PainSc: Asleep         Complications: No complications documented.

## 2020-05-17 ENCOUNTER — Encounter: Payer: Self-pay | Admitting: Gastroenterology

## 2020-09-12 ENCOUNTER — Other Ambulatory Visit: Payer: Self-pay

## 2020-09-12 ENCOUNTER — Encounter: Payer: Self-pay | Admitting: Emergency Medicine

## 2020-09-12 ENCOUNTER — Ambulatory Visit
Admission: EM | Admit: 2020-09-12 | Discharge: 2020-09-12 | Disposition: A | Discharge status: Home / Self Care | Payer: BC Managed Care – PPO

## 2020-09-12 DIAGNOSIS — G8929 Other chronic pain: Secondary | ICD-10-CM

## 2020-09-12 DIAGNOSIS — M545 Low back pain, unspecified: Secondary | ICD-10-CM

## 2020-09-12 MED ORDER — MELOXICAM 15 MG PO TABS: 15.0000 mg | ORAL_TABLET | Freq: Every day | ORAL | 0 refills | Status: AC

## 2020-09-12 MED ORDER — TIZANIDINE HCL 4 MG PO TABS: 4.0000 mg | ORAL_TABLET | Freq: Every evening | ORAL | 0 refills | Status: AC | PRN

## 2020-09-12 NOTE — ED Triage Notes (Addendum)
Pt presents today with c/o of lower back pain intermittently since December with last episode this week. He takes Meloxicam and Tizanidine (requests refill). Denies injury. Pain 7/10 He also reports rash to right upper back.
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
## 2020-09-12 NOTE — Discharge Instructions (Addendum)
BACK PAIN: Stressed avoiding painful activities . RICE (REST, ICE, COMPRESSION, ELEVATION) guidelines reviewed. May alternate ice and heat. Consider use of muscle rubs, Salonpas patches, etc. Use medications as directed including muscle relaxers if prescribed. Take anti-inflammatory medications as prescribed or OTC NSAIDs/Tylenol.  F/u with PCP in 7-10 days for reexamination, and please feel free to call or return to the urgent care at any time for any questions or concerns you may have and we will be happy to help you!   BACK PAIN RED FLAGS: If the back pain acutely worsens or there are any red flag symptoms such as numbness/tingling, leg weakness, saddle anesthesia, or loss of bowel/bladder control, go immediately to the ER. Follow up with [place] as scheduled or sooner if the pain does not begin to resolve or if it worsens before the follow up    Since you have had daily back pain for 8-9 months, you need to see ortho and will likely need an MRI. You have a condition requiring you to follow up with Orthopedics so please call one of the following office for appointment:   [facility] [address] [place], [place] [address] Phone: [phone]  [facility] [address], [place], [place] [address] Phone: [phone]

## 2021-08-04 ENCOUNTER — Ambulatory Visit (INDEPENDENT_AMBULATORY_CARE_PROVIDER_SITE_OTHER): Payer: BC Managed Care – PPO

## 2021-08-04 ENCOUNTER — Ambulatory Visit
Admission: EM | Admit: 2021-08-04 | Discharge: 2021-08-04 | Disposition: A | Discharge status: Home / Self Care | Payer: BC Managed Care – PPO | Attending: Nurse Practitioner | Admitting: Nurse Practitioner

## 2021-08-04 DIAGNOSIS — M79622 Pain in left upper arm: Secondary | ICD-10-CM

## 2021-08-04 DIAGNOSIS — M25512 Pain in left shoulder: Secondary | ICD-10-CM

## 2021-08-04 DIAGNOSIS — M79602 Pain in left arm: Secondary | ICD-10-CM | POA: Diagnosis not present

## 2021-08-04 DIAGNOSIS — W19XXXA Unspecified fall, initial encounter: Secondary | ICD-10-CM

## 2021-08-04 MED ORDER — KETOROLAC TROMETHAMINE 60 MG/2ML IM SOLN
60.0000 mg | Freq: Once | INTRAMUSCULAR | Status: AC
  Administered 2021-08-04: 60 mg via INTRAMUSCULAR

## 2021-08-04 MED ORDER — CYCLOBENZAPRINE HCL 5 MG PO TABS: 5.0000 mg | ORAL_TABLET | Freq: Three times a day (TID) | ORAL | 0 refills | Status: AC | PRN

## 2021-08-04 MED ORDER — PREDNISONE 20 MG PO TABS: ORAL_TABLET | ORAL | 0 refills | Status: AC

## 2021-08-04 NOTE — ED Triage Notes (Signed)
Pt c/o fall on the left side 1 week ago and is now having left arm and shoulder pain.

## 2021-08-04 NOTE — Discharge Instructions (Addendum)
Xray Bone spur is a bony growth that forms on bone. Chronic tear Rotator Cuff  Humerus is normal Ketorolac 60 mg given in the office Start Prednisone Dosepak as directed Cyclobenzaprine 5 mg nightly Please see educational material  Referral to Physical therapy if needed
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
## 2022-03-08 ENCOUNTER — Emergency Department
Admission: EM | Admit: 2022-03-08 | Discharge: 2022-03-08 | Disposition: A | Discharge status: Home / Self Care | Payer: No Typology Code available for payment source | Attending: Emergency Medicine | Admitting: Emergency Medicine

## 2022-03-08 ENCOUNTER — Other Ambulatory Visit: Payer: Self-pay

## 2022-03-08 ENCOUNTER — Ambulatory Visit: Admission: EM | Admit: 2022-03-08 | Discharge: 2022-03-08 | Payer: BC Managed Care – PPO

## 2022-03-08 ENCOUNTER — Emergency Department: Payer: No Typology Code available for payment source

## 2022-03-08 ENCOUNTER — Encounter: Payer: Self-pay | Admitting: Emergency Medicine

## 2022-03-08 DIAGNOSIS — S3022XA Contusion of scrotum and testes, initial encounter: Secondary | ICD-10-CM | POA: Diagnosis not present

## 2022-03-08 DIAGNOSIS — S39012A Strain of muscle, fascia and tendon of lower back, initial encounter: Secondary | ICD-10-CM

## 2022-03-08 DIAGNOSIS — Y990 Civilian activity done for income or pay: Secondary | ICD-10-CM | POA: Insufficient documentation

## 2022-03-08 DIAGNOSIS — S3994XA Unspecified injury of external genitals, initial encounter: Secondary | ICD-10-CM

## 2022-03-08 DIAGNOSIS — E119 Type 2 diabetes mellitus without complications: Secondary | ICD-10-CM | POA: Diagnosis not present

## 2022-03-08 DIAGNOSIS — I10 Essential (primary) hypertension: Secondary | ICD-10-CM | POA: Diagnosis not present

## 2022-03-08 DIAGNOSIS — N50812 Left testicular pain: Secondary | ICD-10-CM

## 2022-03-08 DIAGNOSIS — S3992XA Unspecified injury of lower back, initial encounter: Secondary | ICD-10-CM | POA: Diagnosis present

## 2022-03-08 DIAGNOSIS — M545 Low back pain, unspecified: Secondary | ICD-10-CM

## 2022-03-08 LAB — URINALYSIS, ROUTINE W REFLEX MICROSCOPIC
Bacteria, UA: NONE SEEN
Bilirubin Urine: NEGATIVE
Glucose, UA: >=500 mg/dL — AB
Hgb urine dipstick: NEGATIVE
Ketones, ur: NEGATIVE mg/dL
Leukocytes,Ua: NEGATIVE
Nitrite: NEGATIVE
Protein, ur: NEGATIVE mg/dL
Specific Gravity, Urine: 1.018 (ref 1.005–1.030)
pH: 6 (ref 5.0–8.0)

## 2022-03-08 MED ORDER — OXYCODONE-ACETAMINOPHEN 5-325 MG PO TABS
1.0000 | ORAL_TABLET | Freq: Once | ORAL | Status: AC
  Administered 2022-03-08: 1 via ORAL
  Filled 2022-03-08: qty 1

## 2022-03-08 MED ORDER — METHOCARBAMOL 500 MG PO TABS: 500.0000 mg | ORAL_TABLET | Freq: Four times a day (QID) | ORAL | 0 refills | Status: DC

## 2022-03-08 MED ORDER — MELOXICAM 15 MG PO TABS: 15.0000 mg | ORAL_TABLET | Freq: Every day | ORAL | 0 refills | Status: DC
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
## 2022-03-08 NOTE — Discharge Instructions (Signed)
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
## 2022-03-08 NOTE — ED Provider Notes (Signed)
-----------------------------------------   3:13 PM on 03/08/2022 -----------------------------------------  Blood pressure (!) 174/99, pulse 85, temperature 99.6 F (37.6 C), temperature source Oral, resp. rate 20, height 6' (1.829 m), weight 119.3 kg, SpO2 95 %.  Assuming care from Ashok Cordia, PA-C.  In short, Douglas Hopkins is a 57 y.o. male with a chief complaint of Groin Pain .  Refer to the original H&P for additional details.  The current plan of care is to await ultrasound, urinalysis.  Patient presents emergency department with complaints of back pain, scrotal/testicular pain.  See previous note for details.  Both of these were acute injuries.  Patient is currently awaiting ultrasound, urinalysis.  Current plan is to treat acute findings as needed, otherwise symptom control medications for the patient's lumbar strain, assist with pain relief of likely testicular/scrotal contusion.  Again pending ultrasound and urinalysis results at this time.Marland Kitchen   ----------------------------------------- 5:46 PM on 03/08/2022 -----------------------------------------  Ultrasound and urinalysis is reassuring.  No evidence of testicular injury.  Patient is having sharp groin pain, low back pain.  Will prescribe anti-inflammatory muscle relaxer for the patient.  This time I will have the patient follow-up with orthopedics as this was work-related.  No other injury or complaint.  Concerning signs and symptoms and return precautions discussed with the patient.  Otherwise follow-up with orthopedics.   ED diagnosis:  Lumbar strain Testicular contusion    Darletta Moll, PA-C 03/08/22 1749    Rada Hay, MD 03/08/22 1929

## 2022-03-08 NOTE — ED Notes (Signed)
Patient is being discharged from the Urgent Care and sent to the Emergency Department via POV . Per Christene Slates, PA, patient is in need of higher level of care due to injury to scrotum. Patient is aware and verbalizes understanding of plan of care.  Vitals:   03/08/22 1126  BP: (!) 182/99  Pulse: 79  Resp: 18  Temp: 98.8 F (37.1 C)  SpO2: 97%
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
## 2022-04-17 ENCOUNTER — Ambulatory Visit
Admission: EM | Admit: 2022-04-17 | Discharge: 2022-04-17 | Disposition: A | Discharge status: Home / Self Care | Payer: BC Managed Care – PPO | Attending: Emergency Medicine | Admitting: Emergency Medicine

## 2022-04-17 DIAGNOSIS — E119 Type 2 diabetes mellitus without complications: Secondary | ICD-10-CM | POA: Diagnosis present

## 2022-04-17 DIAGNOSIS — I10 Essential (primary) hypertension: Secondary | ICD-10-CM | POA: Diagnosis not present

## 2022-04-17 LAB — BASIC METABOLIC PANEL
Anion gap: 6 (ref 5–15)
BUN: 14 mg/dL (ref 6–20)
CO2: 27 mmol/L (ref 22–32)
Calcium: 8.4 mg/dL — ABNORMAL LOW (ref 8.9–10.3)
Chloride: 98 mmol/L (ref 98–111)
Creatinine, Ser: 0.84 mg/dL (ref 0.61–1.24)
GFR, Estimated: >60 mL/min (ref >60)
Glucose, Bld: 269 mg/dL — ABNORMAL HIGH (ref 70–99)
Potassium: 3.7 mmol/L (ref 3.5–5.1)
Sodium: 131 mmol/L — ABNORMAL LOW (ref 135–145)

## 2022-04-17 MED ORDER — METFORMIN HCL ER (OSM) 500 MG PO TB24
500.0000 mg | ORAL_TABLET | Freq: Two times a day (BID) | ORAL | 1 refills | Status: AC
Start: 2022-04-17 — End: 2022-10-14

## 2022-04-17 MED ORDER — LISINOPRIL 10 MG PO TABS: 10.0000 mg | ORAL_TABLET | Freq: Every day | ORAL | 1 refills | Status: AC

## 2022-04-17 NOTE — ED Triage Notes (Signed)
Pt presents to UC for elevated BP x2 mon. Denies any dx of htn, is sch to see new pcp [facility]  in July. Denies any dizziness or HA. Highest being 173/80 last night.
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
## 2022-04-17 NOTE — Discharge Instructions (Addendum)
Start taking the lisinopril 10 mg once daily for your blood pressure starting tomorrow morning.  I also asked her to start taking metformin 500 mg twice daily with meals for control of your blood sugar.  He is to continue to monitor your blood pressure at home.  Record is on the sheet that you have been given and take them to your primary care provider new appointment in July.  You also need to get a home glucose monitor and check your blood sugar first thing in the morning and in the evening.  Record these values.  Take these with you when you see your new primary care provider.  Continue your exercise.  Decrease the amount of sweets and simple sugars that you have in your diet.  Continue to drink plenty of water.

## 2022-11-30 ENCOUNTER — Other Ambulatory Visit: Payer: Self-pay

## 2022-11-30 DIAGNOSIS — M5135 Other intervertebral disc degeneration, thoracolumbar region: Secondary | ICD-10-CM

## 2022-12-14 ENCOUNTER — Ambulatory Visit
Admission: RE | Admit: 2022-12-14 | Discharge: 2022-12-14 | Disposition: A | Discharge status: Home / Self Care | Payer: Disability Insurance | Source: Ambulatory Visit

## 2022-12-14 DIAGNOSIS — M5135 Other intervertebral disc degeneration, thoracolumbar region: Secondary | ICD-10-CM
